# Patient Record
Sex: Male | Born: 1945 | Race: White | Hispanic: No | Marital: Single
Health system: Southern US, Community
[De-identification: ages and names within clinical notes are randomized; demographics above are authoritative.]

---

## 1997-12-29 ENCOUNTER — Other Ambulatory Visit: Admission: RE | Admit: 1997-12-29 | Discharge: 1997-12-29 | Payer: Self-pay | Admitting: Urology

## 2003-06-24 ENCOUNTER — Ambulatory Visit (HOSPITAL_COMMUNITY): Admission: RE | Admit: 2003-06-24 | Discharge: 2003-06-24 | Payer: Self-pay | Admitting: *Deleted

## 2006-07-03 ENCOUNTER — Inpatient Hospital Stay (HOSPITAL_COMMUNITY): Admission: EM | Admit: 2006-07-03 | Discharge: 2006-07-09 | Payer: Self-pay | Admitting: Emergency Medicine

## 2006-07-31 ENCOUNTER — Ambulatory Visit: Payer: Self-pay | Admitting: Surgery

## 2006-08-03 ENCOUNTER — Encounter (HOSPITAL_COMMUNITY): Admission: RE | Admit: 2006-08-03 | Discharge: 2006-11-01 | Payer: Self-pay | Admitting: Interventional Cardiology

## 2006-10-23 ENCOUNTER — Ambulatory Visit: Payer: Self-pay | Admitting: Surgery

## 2006-10-23 ENCOUNTER — Encounter: Admission: RE | Admit: 2006-10-23 | Discharge: 2006-10-23 | Payer: Self-pay | Admitting: Surgery

## 2006-11-02 ENCOUNTER — Encounter (HOSPITAL_COMMUNITY): Admission: RE | Admit: 2006-11-02 | Discharge: 2006-11-30 | Payer: Self-pay | Admitting: Interventional Cardiology

## 2007-01-27 IMAGING — CR DG CHEST 1V PORT
1 series · 1 of 1 positions shown · non-contrast
Comparison: 07/05/06

CLINICAL DATA: Acute myocardial infarction.  
 PORTABLE CHEST- 1 VIEW:

[view not recorded]
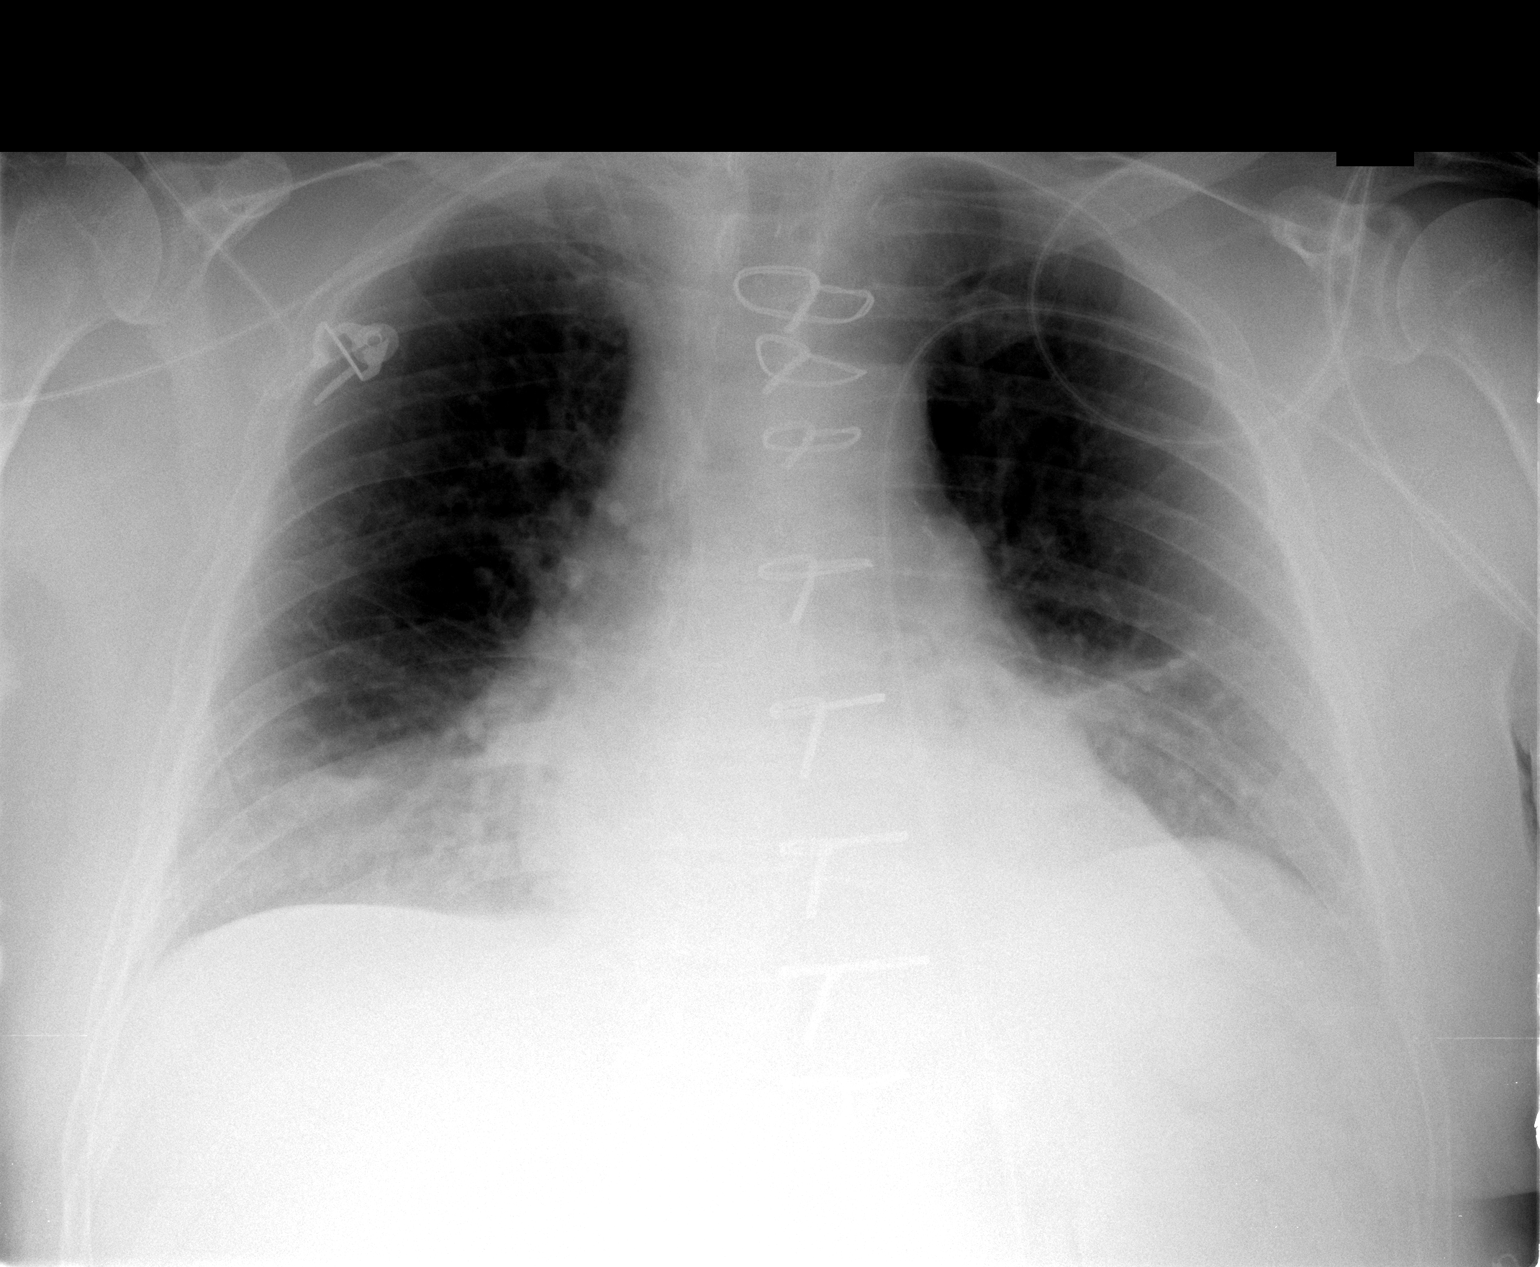

[1 of 1 positions shown; findings below may reference images not displayed]

FINDINGS: Right IJ central venous catheter is in the region of the proximal SVC.  Decreased lung volumes with probable basilar atelectasis and possibly small pleural effusions.  Heart and mediastinum are stable.  No acute bone abnormalities.
IMPRESSION: Minimal change with basilar atelectasis and probable small effusions.

## 2007-06-18 ENCOUNTER — Ambulatory Visit (HOSPITAL_BASED_OUTPATIENT_CLINIC_OR_DEPARTMENT_OTHER): Admission: RE | Admit: 2007-06-18 | Discharge: 2007-06-18 | Payer: Self-pay | Admitting: Orthopedic Surgery

## 2007-06-18 ENCOUNTER — Encounter (INDEPENDENT_AMBULATORY_CARE_PROVIDER_SITE_OTHER): Payer: Self-pay | Admitting: Orthopedic Surgery

## 2010-11-01 NOTE — Op Note (Signed)
Luke Hurley, Luke Hurley                  ACCOUNT NO.:  [id number]   MEDICAL RECORD NO.:  [id number]          PATIENT TYPE:  AMB   LOCATION:  DSC                          FACILITY:  [facility]   PHYSICIAN:  Katy Fitch. Sypher, [name] DATE OF BIRTH:  December 04, 1945   DATE OF PROCEDURE:  06/18/2007  DATE OF DISCHARGE:                               OPERATIVE REPORT   PREOPERATIVE DIAGNOSIS:  Complex Dupuytren's contracture involving left  palm with significant flexion contractures of long ring and small  fingers and the thumb index webspace natatory ligament contracture.   POSTOPERATIVE DIAGNOSIS:  Complex Dupuytren's contracture involving left  palm with significant flexion contractures of long ring and small  fingers and the thumb index webspace natatory ligament contracture.   OPERATION:  1. Resection Dupuytren's contracture from left palm and small finger      with V-Y advancement flaps for skin lengthening and a limited open      palm technique.  2. Resection of Dupuytren's contracture from ring finger with the V-Y      advancement flap technique.  3. Resection of Dupuytren's contracture of long finger with the V-Y      advancement flap technique.  4. Resection of Dupuytren's contracture from thumb index webspace.   OPERATIVE SURGEON:  Josephine Igo, MD.   ASSISTANT:  Annye Rusk PA-C.   ANESTHESIA:  Anesthesia is general by LMA supplemented by left  infraclavicular block.  Supervising anesthesiologist Dr. Gypsy Balsam   INDICATIONS:  Luke Hurley is a 65 year old gentleman referred through the  courtesy of Dr. Annitta Needs of [facility] for evaluation of a progressive  left Dupuytren's contracture.  We had a lengthy preoperative discussion  regarding the etiology, pathogenesis and natural history of Dupuytren's  contracture.  We discussed the alternative treatments for this problem,  including needle aponeurotomy, injection of collagenase and surgical  fasciectomy.   After discussion of the  potential advantages and risks of each  procedure, Luke Hurley proceeds with fasciectomy at this time.   Preoperatively he was advised of potential risks of infection,  anesthetic complication and was advised that he can expect a recurrence  of more Dupuytren's disease and prevalence of Dupuytren's disease in  both hands, given his genetic predisposition.   After informed consent he is brought to the operating room at this time.   PROCEDURE DETAILS:  Luke Hurley Surgeon is brought to the operating room and  placed in supine position on the operating table.   After informed consent by Dr. Gypsy Balsam in the holding area an  infraclavicular block was placed without complication.   He was subsequently brought to room #2 and placed in supine position on  the operating table and then under Dr. Burnett Corrente strict supervision,  general endotracheal anesthesia induced.   The left arm was prepped with Betadine soap solution and sterilely  draped.  Then, 1 gram of Ancef was administered as IV prophylactic  antibiotic.   The left arm was exsanguinated with an Esmarch bandage and arterial  tourniquet on the proximal brachium inflated to 230 mmHg.  Procedure  commenced with planning Brunner zigzag  incisions anticipating the V-Y  advancement flaps and a partial open palm technique between the ring and  small fingers.  The skin incisions were taken sharply.  The skin flaps  elevated on the plane between the pathologic fascia in the dermis.  Overlying the palmar aspect of the small finger MP joint, a near full-  thickness skin excision was necessary due to the nodular disease in the  skin.   After the skin flaps were elevated for the long, ring and small fingers,  as well as the palm.  The pathologic fascia was meticulously dissected  with removal of the pre tendinous fibers to the long, ring and small  fingers.  Large fascial sheets and nodular disease from the small finger  ulnar aspect, ring finger ulnar aspect  and long finger ulnar aspect.  The neurovascular bundles were meticulously dissected.  All fascia with  the potential to transform including Grayson's ligaments, central cord  and spiral bands were resected to prevent recurrence to the best of our  abilities.   After the fascia was excised, the MP joints were fully extended beyond  neutral and the IP joints were fully extended.   A Brunner zigzag incision was fashioned at the thumb MP joint and the  natatory contracture and nodule at this level was resected.  Care was  taken throughout dissection to protect the neurovascular structures.   Bleeding points were cauterized with bipolar forceps under saline.  The  skin margins were then tailored with the V-Y advancement flap technique  to lengthen the skin for the ring and small fingers, as well as the  ulnar aspect of long finger.  The wounds were then repaired with corner  suture of 5-0 nylon followed by interrupted mattress suture of 5-0  nylon.  There no apparent complications.   Luke Hurley palm was left open between the ring and small fingers of the  distal palmar crease.  The wound was dressed with Adaptic, Silvadene,  sterile gauze, sterile Kerlix, sterile Webril and a volar plaster splint  maintaining full extension of the long ring and small finger MP joints.   The tourniquet was released with immediate cap refill to the fingers and  thumb.  There no apparent complications.      Katy Fitch Sypher, [name]  Electronically Signed     RVS/MEDQ  D:  06/18/2007  T:  06/18/2007  Job:  [id number]   cc:   Luke Hurley, [name]

## 2010-11-04 NOTE — Consult Note (Signed)
Luke Hurley, Luke Hurley NO.:  [id number]   MEDICAL RECORD NO.:  [id number]          PATIENT TYPE:  INP   LOCATION:  2807                         FACILITY:  [facility]   PHYSICIAN:  Luke Hurley, [name]     DATE OF BIRTH:  [birth year]   DATE OF CONSULTATION:  07/03/2006  DATE OF DISCHARGE:                                 CONSULTATION   REFERRING PHYSICIAN:  Celso Hurley, [name]   REASON FOR CONSULTATION:  Left main and severe three-vessel coronary  artery disease with acute ST-segment-elevation MI.   CLINICAL HISTORY:  I was asked to evaluate this patient by Dr. Darci Hurley, III, in the cardiac catheterization lab for consideration of  coronary bypass graft surgery.  He is a 65 year old gentleman with no  prior cardiac history who does have multiple cardiac risk factors.  He  developed acute onset of substernal chest pain radiating into his arms  with tingling in his arms around 8:35 a.m.  It had not resolved after 45  minutes, and he presented to the emergency room where a code STEMI was  called, and the patient was taken to the catheterization lab urgently.  Cardiac catheterization showed a 70% proximal left main stenosis.  The  LAD had 70-80% proximal stenosis with thrombus present.  The left  circumflex had 95% mid vessel stenosis.  The right coronary artery had  about 95% ostial stenosis.  Left ventricular function was fairly well  preserved with ejection fraction of about 60% with moderate inferior  apical hypokinesis.  There was no gradient across the aortic valve and  no mitral regurgitation.  The patient had an intra-aortic balloon pump  placed in the catheterization lab for ongoing chest pain with complete  resolution of his pain.   REVIEW OF SYSTEMS:  GENERAL:  He denies any fever or chills.  Has had no  recent weight changes.  He has had some fatigue for several months.  EYES:  Negative.  ENT:  Negative.  ENDOCRINE: He denies diabetes and  hypothyroidism.  CARDIOVASCULAR:  He has had no prior chest pain.  Denies dyspnea on exertion.  He has had no peripheral edema.  He has had  no orthopnea or PND.  RESPIRATORY:  Denies cough and sputum production.  GI:  Has had no nausea or vomiting.  Has had no dysphagia.  He denies  melena and bright red blood per rectum.  GU:  Denies dysuria and  hematuria.  NEUROLOGICAL:  Has had no history of TIA or stroke.  Denies  any dizziness or syncope.  He has had no focal weakness or numbness.  VASCULAR:  He has had no claudication or phlebitis. ALLERGIES: None.  PSYCHIATRIC: Negative.  HEMATOLOGICAL: Negative. MUSCULOSKELETAL:  Negative.   FAMILY HISTORY:  His father had myocardial infarction and died in his  10s.   SOCIAL HISTORY:  He lives alone.  He is not married, has one son who is  away at school.  He quit smoking many years ago.  He drinks about two  glass of wine per day.  PAST MEDICAL HISTORY:  Significant for:  1. Hypertension.  2. Hyperlipidemia.   He denies any previous surgery.   MEDICATIONS:  Lisinopril, Lipitor, and aspirin daily.   PHYSICAL EXAMINATION:  VITAL SIGNS: His blood pressure was 135/75. His  pulse is 75 and regular.  Respiratory rate is 16 and unlabored.  GENERAL: He is a well-developed white male in no distress.  HEENT:  Exam shows him to be normocephalic and atraumatic.  Pupils are  equal, reactive to light and accommodation.  Extraocular muscles are  intact.  His throat is clear.  NECK:  Exam shows normal carotid pulses bilaterally.  No bruits.  There  is no adenopathy or thyromegaly.  CARDIAC:  Exam shows regular rate and rhythm with normal S1-S2.  There  is no murmur, rub, or gallop.  LUNGS: clear.  ABDOMINAL: Exam shows active bowel sounds.  Abdomen is soft, obese and  nontender.  No palpable masses or organomegaly.  EXTREMITIES: Exam shows no peripheral edema.  Pedal pulses palpable  bilaterally.  SKIN: Warm and dry.  NEUROLOGIC:  Exam shows him  to be alert and oriented x3.  Motor and  sensory exams grossly normal.   IMPRESSION:  [name] has significant left main and severe three-vessel  coronary artery disease presenting with acute ST-segment-elevation  myocardial infarction.  His left anterior descending is most likely  culprit with some proximal thrombus present and a small amount of distal  embolization or thrombus to the apical left anterior descending. He is  currently pain free with the intra-aortic balloon pump placed.  I agree  that proceeding with coronary bypass graft surgery today is best  treatment to prevent further ischemia and infarction.  I discussed the  operative procedure with the patient and family including alternatives,  benefits, and risks including bleeding, blood transfusion, infection,  stroke, myocardial infarction, graft failure, and death.  He understands  and agrees to proceed.      Luke Hurley, [name]  Electronically Signed     BB/MEDQ  D:  07/03/2006  T:  07/03/2006  Job:  [id number]   cc:   Luke Hurley, [name]

## 2010-11-04 NOTE — Op Note (Signed)
NAMEKALLUM, Luke Hurley                  ACCOUNT NO.:  [id number]   MEDICAL RECORD NO.:  [id number]          PATIENT TYPE:  INP   LOCATION:  2399                         FACILITY:  [facility]   PHYSICIAN:  Evelene Croon, [name]     DATE OF BIRTH:  November 26, 1945   DATE OF PROCEDURE:  07/03/2006  DATE OF DISCHARGE:                               OPERATIVE REPORT   PREOPERATIVE AND POSTOPERATIVE DIAGNOSIS:  Left main and severe three-  vessel coronary disease, status post acute myocardial infarction.   OPERATIVE PROCEDURE:  Emergency median sternotomy, extracorporeal  circulation, coronary bypass graft surgery x 5, using a left internal  mammary artery graft to left anterior descending coronary, with a  sequential saphenous vein graft to the first and second diagonal  branches of the LAD, a saphenous vein graft to the obtuse marginal  branch of the left circumflex coronary artery, and a saphenous vein  graft to the right coronary.  Endoscopic vein harvesting from the right  leg.   ATTENDING SURGEON:  Evelene Croon, [name]   ASSISTANT:  Belenda Cruise Dominic [facility]   ANESTHESIA:  General endotracheal.   CLINICAL HISTORY:  This patient is a 65 year old gentleman with  hypertension, hyperlipidemia, family history of heart disease, who  presented with a acute ST-segment elevation MI starting about 8:35 this  morning.  He was taken to the cath lab as a code STEMI and  catheterization showed about 70% left main stenosis.  The LAD had 70-80%  proximal and mid stenosis with thrombus present.  There appeared to be  some embolic phenomena to the distal LAD near the apex.  The LAD gave  off two diagonal branches that appeared to have almost a common trunk.  Both these had significant proximal stenosis in them.  The left  circumflex had about 95% midvessel stenosis before a large obtuse  marginal branch.  The right coronary artery was a large vessel that had  about 95% ostial stenosis.  Left ventricular function was  well-preserved  with inferior apical hypokinesis.  An intra-aortic balloon pump was  placed in the cath lab for ongoing chest pain.  The chest pain resolved  was felt that proceeding with emergent coronary bypass graft surgery is  the best treatment to further ischemia and infarction.  I discussed the  operative procedure with him and his family including alternatives,  benefits, and risks including but not limited to bleeding, blood  transfusion, infection, stroke, myocardial infarction, graft failure,  and death.  He understood and agreed to proceed.   OPERATIVE PROCEDURE:  The patient was taken to the operating room and  placed on table in supine position.  After induction of general  endotracheal anesthesia, a Foley catheter was placed in bladder using  sterile technique.  Then the chest, abdomen and both lower extremities  were prepped and draped usual sterile manner.  The chest was entered  through a median sternotomy incision.  The pericardium opened in the  midline.  Examination heart showed good ventricular contractility.  The  ascending aorta had no palpable plaques in it.   Then the  left internal mammary artery was harvested from the chest wall  as a pedicle graft.  This was a large caliber vessel with excellent  blood flow through it.  At the same time segment of greater saphenous  vein was harvested from the right leg using endoscopic vein harvest  technique.  This vein was a medium size and good quality.   Then the patient was heparinized and when an adequate activated clotting  time was achieved, the distal ascending aorta was cannulated using a 20-  [place] aortic cannula for arterial inflow.  Venous outflow was achieved  using a two-stage venous cannula through the right atrial appendage.  Antegrade cardioplegia and vent cannula was inserted in the aortic root.   The patient was placed on cardiopulmonary bypass and distal coronaries  identified.  The LAD is a large  graftable vessel.  It was heavily  diseased in the proximal portion.  There was patchy plaque throughout  the midportion.  The first and second diagonal branches were both medium  size graftable vessels with no distal disease in them.  The obtuse  marginal was large graftable vessel with no distal disease in it.  The  right coronary artery was a large vessel that had no distal disease in  it.   Then the aorta was crossclamped and 500 mL of cold blood antegrade  cardioplegia was administered in the aortic root with quick arrest of  the heart.  Systemic hypothermia to 28 degrees centigrade and topical  hypothermia with iced saline was used.  A temperature probe was placed  in the septum and insulating pad in the pericardium.   The first distal anastomosis was performed to the distal right coronary  artery.  The internal diameter of this vessel was greater than 2.5 mm.  Conduit used was a segment greater saphenous vein and anastomosis  performed with a end-to-side manner using continuous 7-0 Prolene suture.  Flow was measured through the graft was excellent.   The second distal anastomosis was performed to the obtuse marginal  branch.  The internal diameter was about 2 mm.  Conduit used was a  second segment of greater saphenous vein and the anastomosis performed  in end-to-side manner using continuous 7-0 Prolene suture.  Flow was  measured through the graft and was excellent.  Then another dose of  cardioplegia given down the vein graft and in the aortic root.   Third distal anastomosis was performed to the second diagonal branch.  The internal diameter was 1.6 mm.  Conduit used was a third segment of  greater saphenous vein and anastomosis performed in a sequential side-to-  side manner using continuous 7-0 Prolene suture.  Flow was measured  through the graft and was excellent.   The fourth distal anastomosis was performed to the first diagonal branch.  The internal diameter of 1.6  mm.  Conduit used was the same  segment of greater saphenous vein and anastomosis performed in a  sequential end-to-side manner using continuous 7-0 Prolene suture.  Flow  was measured through the graft and was excellent.  Then another dose of  cardioplegia was given down into the vein grafts and into the aortic  root.   The fifth distal anastomosis was performed to the distal LAD.  The  internal diameter was about 2.5 mm.  Conduit used was a left internal  mammary graft and this was brought through an opening in the left  pericardium anterior to the phrenic nerve.  It was anastomosed to  the  LAD in end-to-side manner using continuous 8-0 Prolene suture.  The  pedicle was sutured to the epicardium with 6-0 Prolene sutures.  The  patient rewarmed to 37 degrees centigrade.  With the crossclamp in  place, the three proximal vein graft anastomoses were performed to the  aortic root in end-to-side manner using continuous 6-0 Prolene suture.  Then the clamp removed from mammary pedicle.  There was rapid warming of  the ventricular septum and return of spontaneous ventricular  fibrillation.  The crossclamp removed with time of 75 minutes.  The  patient spontaneously developed sinus rhythm.  The proximal and distal  anastomoses appeared hemostatic and line of the grafts satisfactory.  Graft markers placed around the proximal anastomoses.  Two temporary  right ventricular and right atrial pacing wires placed and brought out  through the skin.   When the patient rewarmed to 37 degrees centigrade, he was weaned from  cardiopulmonary bypass on no inotropic agents with the intra-aortic  balloon pump placed at one to one augmentation after he was weaned from  bypass.  Total bypass time was 91 minutes.  Cardiac function appeared  excellent.  Cardiac output of 5.5 liters minute.  Protamine was given  and venous and aortic cannulae removed without difficulty.  Hemostasis  was achieved.  Four chest  tubes were placed with bilateral pleural  tubes, with a tube in the post pericardium and one in the anterior  mediastinum.  The pericardium was loosely reapproximated over the heart.  Sternum was closed with #6 stainless steel wires.  Fascia was closed  with continuous #1 Vicryl suture.  Subcu tissue was closed with  continuous 2-0 Vicryl and the skin with a 3-0 Vicryl subcuticular  closure.  The lower extremity vein harvest site was closed in layers in  similar manner.  The sponge, needle and instrument counts correct  according to scrub nurse.  Dry sterile dressings were applied over the  incisions and around the chest tubes which were hooked to Pleur-Evac  suction.  The patient remained hemodynamically stable, transferred to  the SICU in guarded but stable condition.      Evelene Croon, [name]  Electronically Signed     BB/MEDQ  D:  07/03/2006  T:  07/04/2006  Job:  [id number]   cc:   [name], [name]  Cardiac cath lab

## 2010-11-04 NOTE — Cardiovascular Report (Signed)
NAMEGREGOIRE, Luke Hurley NO.:  [id number]   MEDICAL RECORD NO.:  [id number]          PATIENT TYPE:  INP   LOCATION:  2807                         FACILITY:  [facility]   PHYSICIAN:  [name], [name]   DATE OF BIRTH:  [birth year]   DATE OF PROCEDURE:  DATE OF DISCHARGE:                            CARDIAC CATHETERIZATION   INDICATIONS:  Acute ST elevation myocardial infarction involving the  anterior wall commencing at 8:30 a.m. on July 03, 2006.   PROCEDURE PERFORMED:  1. Left heart cath.  2. Selective coronary angiography.  3. Left ventriculography.  4. Insertion of emergency intra-aortic balloon pump.   DESCRIPTION:  After informed consent under emergency circumstances, the  patient was brought from the emergency room to the cardiac  catheterization laboratory room 5.  1 mg of IV Versed was administered.  1% Xylocaine was used to achieve local anesthesia in the right  iliofemoral area.  We inserted a 6-French sheath was a single anterior  stick using the modified Seldinger technique.  A 6-French A2  multipurpose catheter was then used for hemodynamic recordings, left  ventriculography by hand injection, selective right coronary and left  coronary angiography.  We then used a CLS side hole 6-French guide  catheter to obtain left coronary shots and identified significant left  main disease, as well as high-grade thrombotic disease in the proximal  LAD, high-grade mid circumflex.  Because of continued chest discomfort  and evidence of distal LAD embolization, as well as ongoing discomfort,  I felt the intra-aortic balloon pump insertion would be indicated.  Before inserting the intra-aortic balloon pump, we used a Judkins right  catheter to get a better image of the right coronary.  This demonstrated  a high-grade ostial right coronary lesion.  We used the Judkins right  catheter again to perform left ventriculography by hand injection and  also to record an  adequate pullback across the aortic valve.   Then using a balloon pump arterial sheath, we exchanged the sheath that  was used for the diagnostic procedure and placed the new sheath using  the modified Seldinger technique over the guidewire.  We then inserted a  40 cm long intra-aortic balloon pump without difficulty and began one-to-  one intra-aortic balloon pumping.  The patient's chest discomfort  resolved after approximately 10 minutes of pumping.  No complications  occurred.   Because of the patient's anatomy, we had called emergently to have a  CVTS consultation.  Dr. Laneta Simmers arrived in the laboratory just as we were  finishing the case.  After discussion initiated by reviewing the digital  coronary angiography, plans are being made for the patient to have  urgent coronary artery bypass grafting.   RESULTS:  1. Hemodynamic data.      a.     Aortic pressure 145/74.      b.     Left ventricular pressure 145/25.  2. Left ventriculography:  There is moderate inferoapical severe      hypokinesis.  EF is 60%.  No mitral regurgitation.  3. Coronary angiography.  a.     Left main coronary.  The left main contains 50-80% stenosis       depending upon the view used.  No distal left main obstruction is       noted.  It is possible that some of the left main obstruction       could be due to coronary spasm.      b.     Left anterior descending coronary.  The LAD is a large       vessel that wraps around left ventricular apex.  The initial shot       demonstrated obstruction of the distal third of the left anterior       descending.  This later opened and is evidence that this was       embolic occlusion.  The proximal LAD contains an eccentric       thrombus filled 80% stenosis between the first and second       diagonal.  Three diagonal branches arise from the LAD.  The       diagonal branches are for the most part free of any significant       obstruction.  The LAD after the third  diagonal in the midvessel       contains segmental 50% narrowing.      c.     Circumflex artery.  The circumflex coronary artery contains       a very focal eccentric high-grade obstruction in the midvessel.       The circumflex terminates on a bifurcating obtuse marginal system.      d.     Right coronary.  The right coronary artery is calcified.       There is ostial 90% stenosis.  The proximal right coronary is also       highly diseased and contains segmental 60-70% stenosis.  The RCA       is large.  It gives a large PDA branch and two left ventricular       branches.   CONCLUSIONS:  1. The patient has severe three-vessel coronary artery disease with      80% proximal thrombus filled left anterior descending that is the      culprit vessel, 95% midcircumflex stenosis, and 95% ostial right      coronary artery.  The right coronary artery is a dominant vessel.      There is also significant left main obstruction in the 50-70%      range.  2. Aborted anterior myocardial infarction due to spontaneous      recanalization of the proximal left anterior descending.  3. Mild left ventricular dysfunction with inferoapical severe      hypokinesis.  Preserved, ejection fraction, however, at 60 percent.  4. Successful placement of intra-aortic balloon pump with resolution      of chest discomfort.   PLAN:  1. IV heparin.  2. Emergency consultation by Dr. Evelene Croon for consideration of      coronary artery bypass grafting.  I discussed the situation with      the patient and his family.      [name], [name]  Electronically Signed     HWS/MEDQ  D:  07/03/2006  T:  07/03/2006  Job:  [id number]   cc:   [name], [name]  Evelene Croon, [name]

## 2010-11-04 NOTE — Discharge Summary (Signed)
NAMESAVVAS, ROPER NO.:  [id number]   MEDICAL RECORD NO.:  [id number]          PATIENT TYPE:  INP   LOCATION:  2032                         FACILITY:  [facility]   PHYSICIAN:  Jerold Coombe, P.A.DATE OF BIRTH:  January 14, 1946   DATE OF ADMISSION:  07/03/2006  DATE OF DISCHARGE:                               DISCHARGE SUMMARY   ANTICIPATED DATE OF DISCHARGE:  January 21 or 22, 2008.   ADMISSION DIAGNOSIS:  Chest pain with acute anterior myocardial  infarction.   DISCHARGE/SECONDARY DIAGNOSES:  1. Left main severe three-vessel coronary disease with acute ST      segment myocardial infarction status post CABG.  2. Hypertension.  3. Hyperlipidemia.  4. Obesity.  5. Postoperative right flank pain, resolved.  6. Postoperative atelectasis.  7. Mild postoperative ileus, clinically resolving.  8. Postoperative hyperglycemia (with hemoglobin A1c 6.3).  9. Postoperative acute blood loss anemia.  10.NO KNOWN DRUG ALLERGIES.  11.History of 80-pack year tobacco use, quit in 2001.  12.History of small hiatal hernia.  13.Cataract surgery ten years ago.  14.History of prostate biopsy about 20 years ago.   PROCEDURES:  1. July 03, 2006, emergent coronary artery bypass graft x5 using      left internal mammary artery to the left anterior descending,      sequential saphenous vein graft to the first and second diagonal      branches of the LAD, saphenous vein graft to the obtuse marginal      branch of left circumflex, saphenous vein graft to the right      coronary artery, endoscopic vein harvesting to the right leg.      Surgeon:  Dr. Evelene Croon.  2. July 03, 2006, cardiac catheterization by Dr. [name]      showing severe three-vessel coronary artery disease with      significant left main obstruction in the 50 to 70% range with 80%      proximal thrombus in the left anterior descending, and mild left      ventricular dysfunction with inferior apical  severe hypokinesis      with preserved ejection fraction of 60% and placement of intra-      aortic balloon pump.   BRIEF HISTORY:  Mr. Bogden is a 65 year old Caucasian male with no known  prior history of coronary artery disease who developed acute substernal  chest pain on July 03, 2006.  He called the EMS after about 45  minutes of pain.  He then was admitted to [facility] Emergency  Department.  EKG shows ST segment elevation with 2 mm of anterior lead  with minimal inferior ST segment elevation of 0.5 to 1 mm.  He was  brought to the catheterization laboratory emergently.  Prior to going  there, received 5000 units of IV heparin bolus as well as 4 baby  aspirin.  A troponin was elevated at 0.65.  CK is 390, CK-MB of 6.7,  myoglobin of 251.   HOSPITAL COURSE:  Jayshon was admitted to [facility] for acute  anterior myocardial infarction.  He was taken urgently to the  catheterization laboratory with results as discussed.  Chest pain did  resolve after placement of intra-aortic balloon pump.  Cardiac surgery  consultation was requested, and he was evaluated by Dr. Evelene Croon who  believes emergent coronary artery bypass grafting was the best treatment  option.  After discussing risks and benefits, the patient agreed to  proceed.   Postoperatively, he was transferred to the Surgical Intensive Care Unit.  He remained there through postoperative day four.  He was extubated,  neurologically intact on postoperative day one.  Chest tubes and Swan  were discontinued as well that day.  He was continued on beta blocker,  aspirin, and statin therapy.  He did require blood glucose monitoring  for hyperglycemia with sugars up to 150.  Hemoglobin was ordered and is  6.3.   On postoperative day two, he remained stable but reported some right  flank pain which he thought may be due to nephrolithiasis.  At that  point, his hemoglobin and hematocrit were stable around 9 and  27.  Physical examination findings showed signs of probable ileus, and it was  felt that there was a question whether his flank pain was related to the  ileus.  He was started on Reglan and observed.  However, the next day,  he had a drop off in his hemoglobin and hematocrit to 823.  Of note, he  had received Toradol which did help his flank pain, but, since he  previously had a balloon pump, it was felt the safest option was to get  an abdominal CT scan to rule out retroperitoneal hematoma.  This was  done on January 18 and showed no evidence of hemorrhage or  retroperitoneal hematoma.  He did have a __________  focal ileus with no  evidence of obstruction or inflammation.  His pelvic CT was in normal  limits, but abdominal findings did show a small hiatal hernia, 3-mm  calcific granuloma on the lateral side of the left lobe of the liver,  and calcific splenic granulomas 1.7 cm and 1.5 and 3 cm upper to mid  left renal cortical cystic foci with no renal hydronephrosis.  There was  moderate atheromatous vascular calcification with normal abdominal aorta  seen.   He did remain in the ICU for another day with monitoring, but by  postoperative day four it was felt appropriate to transfer out of the  unit to unit 2000 where he remained until discharge.   Once on 2000, he was able to tolerate a regular heart-healthy diet, and  bowel sounds began to return.  Abdominal distention continued to  improve.  His Lantus insulin was stopped, and sugars were stable at 110  to 122 over the last 24 hours.  The pain was controlled on oral  medications.  His heart had a regular rate and rhythm.  His lungs were  clear.  Extremities showed about 1+ edema.  Incisions were healing  without signs of infection.  He was continued on short-term diuretics  for postoperative __________ .   Chest x-ray on January 20 showed improving atelectasis with small effusion.  His vitals were stable showing a blood pressure  of 110/65,  heart rate 70 to 80 in sinus rhythm, he was afebrile, and was saturating  94% on room air.  He was ambulating in the hallways with cardiac  rehabilitation and making progress.   is postoperative laboratory data showed white blood count of 10.4,  hemoglobin 9.6, hematocrit  28.5, platelet count 237, sodium 139,  potassium 4.1, chloride 105, CO2 28, blood glucose 103, BUN 17,  creatinine 0.9.  Hemoglobin A1c is slightly elevated at 6.3.  His AST  was 28, ALT 30, total bilirubin 0.6, alkaline phosphatase 60, total  protein of 5.9, albumin 3.3.   DISPOSITION:  If Mr. Boardley continues to make steady progress, the  patient will be ready for discharge on postoperative day 6, July 09, 2006.   DISCHARGE MEDICATIONS:  1. Coated aspirin 325 mg p.o. daily.  2. Lopressor 25 mg p.o. b.i.d.  3. Lisinopril 10/12.5 mg p.o. daily.  4. Lipitor 20 mg p.o. daily.  5. Prilosec daily at home dose.  6. Iron 150 mg p.o. daily.  7. Oxycodone 5 mg 1 to 2 tablets p.o. every 4 to 6 hours p.r.n. pain.   DISCHARGE INSTRUCTIONS:  He is to avoid driving or heaving lifting more  than 10 pounds.  Encouraged to continue daily walking and breathing  exercises.  He is to follow a low-salt, low-cholesterol diet.  He may  shower and clean incision with soap and water.  He is to notify  __________  if he develops fever greater than 101 or redness or drainage  from his incision site.   FOLLOWUP:  1. He is to call [phone] to schedule two-week followup with Dr. Verdis Prime and have a chest x-ray at this appointment.  2. He is to see Dr. Evelene Croon at his office in approximately three      weeks for followup.  His office will contact him regarding specific      appointment date and time.  3. He needs to schedule followup with his primary physician to      reevaluate his blood sugars.      Jerold Coombe, P.A.     AWZ/MEDQ  D:  07/08/2006  T:  07/08/2006  Job:  [id number]   cc:   Evelene Croon, [name]  [name], [name]

## 2010-11-04 NOTE — H&P (Signed)
Luke Hurley, SMAIL NO.:  [id number]   MEDICAL RECORD NO.:  [id number]          PATIENT TYPE:  INP   LOCATION:  1825                         FACILITY:  [facility]   PHYSICIAN:  [name], [name]   DATE OF BIRTH:  25-Jul-1945   DATE OF ADMISSION:  07/03/2006  DATE OF DISCHARGE:                              HISTORY & PHYSICAL   CHIEF COMPLAINT:  Chest pain.   HISTORY OF PRESENT ILLNESS:  Mr. Grewe is a 65 year old male patient  with no known history of coronary artery disease who developed acute  substernal chest pain today and called EMS after about 45 minutes of  pain.  He then came to the emergency room . An EKG showed ST segment  elevation of 2 mm in the anterior leads with minimal inferior ST segment  elevation of 1/2 to 1 mm.  He was brought to the cath lab emergently.   PAST MEDICAL HISTORY:  1. Hypertension.  2. Hyperlipidemia.  3. Obesity.  4. Long-term medication use.  5. Family history of coronary artery disease.   ALLERGIES:  No known drug allergies.   MEDICATIONS:  1. Lisinopril.  2. Lipitor.  3. Aspirin 325 mg one half tablet daily.   SOCIAL HISTORY:  He drinks two glasses of wine a day.  No tobacco.  No  illicit drug use.  He is single.   FAMILY HISTORY:  Dad had a history of coronary artery disease and  suffered a myocardial infarction in his 70's.   PHYSICAL EXAMINATION:  VITAL SIGNS:  Blood pressure 162/89, O2  saturation 100% on 2 liters, pulse 74, respirations 16.  HEENT:  Grossly normal.  No carotid or subclavian bruits.  No JVD or  thyromegaly.  Sclerae are clear.  Conjunctivae are normal.  Nares  without drainage.  CHEST:  Clear to auscultation bilaterally.  No wheezing or rhonchi.  HEART:  Regular rate and rhythm.  No murmur.  ABDOMEN:  Good bowel sounds, nontender, nondistended, no masses, no  bruits, obese.  EXTREMITIES:  No peripheral edema.  Palpable PT/DP pulses bilaterally.  NEUROLOGICAL:  Alert and oriented x3.  SKIN:   Diaphoretic.   LABORATORY DATA:  Creatinine 1.2, potassium 3.6, sodium 139, hemoglobin  12.0, hematocrit 43.  Chest x-ray is pending.   ASSESSMENT:  1. Acute anterior myocardial infarction.  2. Hypertension.  3. Hyperlipidemia .  4. Obesity.  5. Family history of coronary artery disease.  6. Long-term medication use.   PLAN:  The patient will be brought to the catheterization lab  emergently, consent was obtained.  The procedure, risks and benefits  were explained to the patient including heart attacks, stroke or even  death.  The patient agreed to proceed in this emergent setting.  Patient  received 5000 unit bolus of heparin in the emergency room as well as  four baby aspirin.  The patient will be taken to ICU postprocedure.      Guy Franco, P.A.      [name], [name]  Electronically Signed    LB/MEDQ  D:  07/03/2006  T:  07/03/2006  Job:  [id number]   cc:   [name], [name]

## 2011-03-24 LAB — I-STAT 8, (EC8 V) (CONVERTED LAB)
Acid-base deficit: 1
Bicarbonate: 23.3
Chloride: 106
Glucose, Bld: 114 — ABNORMAL HIGH
HCT: 45
Hemoglobin: 15.3
Operator id: 123881
Potassium: 4.2
TCO2: 24
pCO2, Ven: 37.7 — ABNORMAL LOW
pH, Ven: 7.399 — ABNORMAL HIGH

## 2013-01-31 ENCOUNTER — Telehealth: Payer: Self-pay

## 2013-01-31 NOTE — Telephone Encounter (Signed)
Pt is wanting to come in and update his dot card that expired back in May and he recently has had to have stress test he is wondering when he comes in if there is something certain that the letter from the cardiologist needs to say Call back number is [phone]

## 2013-01-31 NOTE — Telephone Encounter (Signed)
He needs stress test results and he needs letter from Cardiology that he can drive/ work with no restrictions.

## 2019-04-29 ENCOUNTER — Other Ambulatory Visit: Payer: Self-pay

## 2019-04-29 DIAGNOSIS — Z20822 Contact with and (suspected) exposure to covid-19: Secondary | ICD-10-CM

## 2019-04-30 LAB — NOVEL CORONAVIRUS, NAA: SARS-CoV-2, NAA: NOT DETECTED

## 2019-05-02 ENCOUNTER — Telehealth: Payer: Self-pay

## 2019-05-02 NOTE — Telephone Encounter (Signed)
Patient called in requesting [place] lab results  - DOB/Address verified - Negative results given. Patient declined being set up on MyChart, no further questions.
